# Patient Record
Sex: Female | Born: 1990 | Race: White | Hispanic: No | Marital: Married | Smoking: Never smoker
Health system: Southern US, Community
[De-identification: ages and names within clinical notes are randomized; demographics above are authoritative.]

---
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
## 2017-12-26 ENCOUNTER — Encounter: Payer: Self-pay | Admitting: Emergency Medicine

## 2017-12-26 ENCOUNTER — Emergency Department
Admission: EM | Admit: 2017-12-26 | Discharge: 2017-12-26 | Disposition: A | Discharge status: Home / Self Care | Payer: BC Managed Care – PPO | Source: Home / Self Care | Attending: Family Medicine | Admitting: Family Medicine

## 2017-12-26 DIAGNOSIS — R6889 Other general symptoms and signs: Secondary | ICD-10-CM | POA: Diagnosis not present

## 2017-12-26 MED ORDER — BENZONATATE 100 MG PO CAPS: 100.0000 mg | ORAL_CAPSULE | Freq: Three times a day (TID) | ORAL | 0 refills | Status: DC

## 2017-12-26 MED ORDER — OSELTAMIVIR PHOSPHATE 75 MG PO CAPS: 75.0000 mg | ORAL_CAPSULE | Freq: Two times a day (BID) | ORAL | 0 refills | Status: DC

## 2017-12-26 NOTE — Discharge Instructions (Signed)
°  You may take 500mg acetaminophen every 4-6 hours or in combination with ibuprofen 400-600mg every 6-8 hours as needed for pain, inflammation, and fever. ° °Be sure to drink at least eight 8oz glasses of water to stay well hydrated and get at least 8 hours of sleep at night, preferably more while sick.  ° °Oseltamivir (Tamiflu) may cause stomach upset including nausea, vomiting and diarrhea.  It may also cause dizziness or hallucinations in children.  To help prevent stomach upset, you may take this medication with food.  If you are still having unwanted symptoms, you may stop taking this medication as it is not as important to finish the entire course like antibiotics.  If you have questions/concerns please call our office or follow up with your primary care provider.   ° °

## 2017-12-26 NOTE — ED Triage Notes (Signed)
Pt c/o cough with mucous, sore throat and fever of 101 last night. sxs started yesterday. Denies meds.

## 2017-12-26 NOTE — ED Provider Notes (Signed)
[facility]    CSN: [id number] Arrival date & time: 12/26/17  0935     History   Chief Complaint Chief Complaint  Patient presents with  . Cough    HPI Rebecca James is a 27 y.o. female.   HPI  Rebecca James is a 27 y.o. female presenting to UC with c/o sudden onset productive cough, sore throat, fever Tmax 101*F, and fatigue since yesterday.  She has not taken any OTC medications today.  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  Denies n/v/d.  She did not get the flu vaccine this year but usually does.    History reviewed. No pertinent past medical history.  There are no active problems to display for this patient.   History reviewed. No pertinent surgical history.  OB History    No data available       Home Medications    Prior to Admission medications   Medication Sig Start Date End Date Taking? Authorizing Provider  norethindrone-ethinyl estradiol (JUNEL FE,GILDESS FE,LOESTRIN FE) 1-20 MG-MCG tablet Take 1 tablet by mouth daily.   Yes [provider]  benzonatate (TESSALON) 100 MG capsule Take 1-2 capsules (100-200 mg total) by mouth every 8 (eight) hours. 12/26/17   Lurene ShadowPhelps, Myron Lona O, PA-C  oseltamivir (TAMIFLU) 75 MG capsule Take 1 capsule (75 mg total) by mouth every 12 (twelve) hours. 12/26/17   Lurene ShadowPhelps, Latalia Etzler O, PA-C    Family History History reviewed. No pertinent family history.  Social History Social History   Tobacco Use  . Smoking status: Never Smoker  . Smokeless tobacco: Never Used  Substance Use Topics  . Alcohol use: Yes  . Drug use: Not on file     Allergies   Doxycycline   Review of Systems Review of Systems  Constitutional: Positive for appetite change, chills, fatigue and fever.  HENT: Positive for congestion, rhinorrhea and sore throat. Negative for ear pain, trouble swallowing and voice change.   Respiratory: Positive for cough. Negative for shortness of breath.     Cardiovascular: Negative for chest pain and palpitations.  Gastrointestinal: Negative for abdominal pain, diarrhea, nausea and vomiting.  Musculoskeletal: Positive for arthralgias and myalgias. Negative for back pain.  Skin: Negative for rash.     Physical Exam Triage Vital Signs ED Triage Vitals  Enc Vitals Group     BP 12/26/17 1008 125/75     Pulse Rate 12/26/17 1008 77     Resp --      Temp 12/26/17 1008 98.4 F (36.9 C)     Temp Source 12/26/17 1008 Oral     SpO2 12/26/17 1008 99 %     Weight 12/26/17 1012 154 lb (69.9 kg)     Height --      Head Circumference --      Peak Flow --      Pain Score 12/26/17 1010 0     Pain Loc --      Pain Edu? --      Excl. in GC? --    No data found.  Updated Vital Signs BP 125/75 (BP Location: Right Arm)   Pulse 77   Temp 98.4 F (36.9 C) (Oral)   Wt 154 lb (69.9 kg)   LMP 12/17/2017   SpO2 99%   Visual Acuity Right Eye Distance:   Left Eye Distance:   Bilateral Distance:    Right Eye Near:   Left Eye Near:  Bilateral Near:     Physical Exam  Constitutional: She is oriented to person, place, and time. She appears well-developed and well-nourished. No distress.  HENT:  Head: Normocephalic and atraumatic.  Right Ear: Tympanic membrane normal.  Left Ear: Tympanic membrane normal.  Nose: Mucosal edema present. Right sinus exhibits no maxillary sinus tenderness and no frontal sinus tenderness. Left sinus exhibits no maxillary sinus tenderness and no frontal sinus tenderness.  Mouth/Throat: Uvula is midline, oropharynx is clear and moist and mucous membranes are normal.  Eyes: EOM are normal.  Neck: Normal range of motion. Neck supple.  Cardiovascular: Normal rate and regular rhythm.  Pulmonary/Chest: Effort normal and breath sounds normal. No stridor. No respiratory distress. She has no wheezes. She has no rales.  Musculoskeletal: Normal range of motion.  Lymphadenopathy:    She has no cervical adenopathy.   Neurological: She is alert and oriented to person, place, and time.  Skin: Skin is warm and dry. She is not diaphoretic.  Psychiatric: She has a normal mood and affect. Her behavior is normal.  Nursing note and vitals reviewed.    UC Treatments / Results  Labs (all labs ordered are listed, but only abnormal results are displayed) Labs Reviewed - No data to display  EKG  EKG Interpretation None       Radiology No results found.  Procedures Procedures (including critical care time)  Medications Ordered in UC Medications - No data to display   Initial Impression / Assessment and Plan / UC Course  I have reviewed the triage vital signs and the nursing notes.  Pertinent labs & imaging results that were available during my care of the patient were reviewed by me and considered in my medical decision making (see chart for details).     Hx and exam c/w viral URI, due to active flu season, discussed starting pt on Tamiflu. Pt would like to try Tamiflu Encouraged fluids,rest, acetaminophen,and ibuprofen F/u with PC Pin 1 week if needed.   Final Clinical Impressions(s) / UC Diagnoses   Final diagnoses:  Flu-like symptoms    ED Discharge Orders        Ordered    oseltamivir (TAMIFLU) 75 MG capsule  Every 12 hours     12/26/17 1020    benzonatate (TESSALON) 100 MG capsule  Every 8 hours     12/26/17 1020       Controlled Substance Prescriptions [place] Controlled Substance Registry consulted? Not Applicable   Rolla Plate 12/26/17 1610

## 2018-04-12 ENCOUNTER — Ambulatory Visit: Payer: BC Managed Care – PPO | Admitting: Family Medicine

## 2018-04-12 DIAGNOSIS — M79671 Pain in right foot: Secondary | ICD-10-CM | POA: Diagnosis not present

## 2018-04-12 NOTE — Patient Instructions (Addendum)
Your history, exam, and ultrasound are reassuring. This is consistent with an extensor tendinitis of your ankle/foot. Ice the area 15 minutes at a time 3-4 times a day. Ibuprofen  three times a day with food OR aleve 2 tabs twice a day with food for pain and inflammation. Wear the boot to rest this when up and walking around for the next 2 weeks. Consider physical therapy, nitro patches. Follow up with me in 2 weeks for reevaluation.

## 2018-04-16 ENCOUNTER — Encounter: Payer: Self-pay | Admitting: Family Medicine

## 2018-04-16 DIAGNOSIS — M79671 Pain in right foot: Secondary | ICD-10-CM | POA: Insufficient documentation

## 2018-04-16 NOTE — Assessment & Plan Note (Signed)
low mileage runner, history exam and ultrasound consistent with an extensor tendinitis of her foot/ankle. Icing, ibuprofen or aleve.  Cam walker when up and walking.  F/u in 2 weeks for reevaluation.

## 2018-04-16 NOTE — Progress Notes (Signed)
PCP: Salem Caster, MD  Subjective:   HPI: Patient is a 27 y.o. female here for right foot pain.  Patient denies known injury or trauma. She reports having dorsal right foot pain for about 2 weeks. She was training for a 10 miler about 1 month ago and did well. She runs about 2-3 times a week about 10 miles total per week. No prior history of stress fracture. Pain bothers her with stepping and pointing her right foot. Has tried resting, icing, aleve. She took a week off from runinng as well. 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 No skin changes, numbness.  History reviewed. No pertinent past medical history.  Current Outpatient Medications on File Prior to Visit  Medication Sig Dispense Refill  . NIKKI 3-0.02 MG tablet Take 1 tablet by mouth daily.  3   No current facility-administered medications on file prior to visit.     History reviewed. No pertinent surgical history.  Allergies  Allergen Reactions  . Doxycycline     Social History   Socioeconomic History  . Marital status: Married    Spouse name: Not on file  . Number of children: Not on file  . Years of education: Not on file  . Highest education level: Not on file  Occupational History  . Not on file  Social Needs  . Financial resource strain: Not on file  . Food insecurity:    Worry: Not on file    Inability: Not on file  . Transportation needs:    Medical: Not on file    Non-medical: Not on file  Tobacco Use  . Smoking status: Never Smoker  . Smokeless tobacco: Never Used  Substance and Sexual Activity  . Alcohol use: Yes  . Drug use: Not on file  . Sexual activity: Not on file  Lifestyle  . Physical activity:    Days per week: Not on file    Minutes per session: Not on file  . Stress: Not on file  Relationships  . Social connections:    Talks on phone: Not on file    Gets together: Not on file    Attends religious service: Not on file    Active member of club or  organization: Not on file    Attends meetings of clubs or organizations: Not on file    Relationship status: Not on file  . Intimate partner violence:    Fear of current or ex partner: Not on file    Emotionally abused: Not on file    Physically abused: Not on file    Forced sexual activity: Not on file  Other Topics Concern  . Not on file  Social History Narrative  . Not on file    History reviewed. No pertinent family history.  BP 128/74   Ht  (1.753 m)   Wt 150 lb (68 kg)   BMI 22.15 kg/m   Review of Systems: See HPI above.     Objective:  Physical Exam:  Gen: NAD, comfortable in exam room  Right foot/ankle: No gross deformity, swelling, ecchymoses FROM with 5/5 strength but mild pain on dorsiflexion and plantarflexion within dorsal proximal foot. TTP over extensor digitorum.  No focal bony tenderness. Negative ant drawer and talar tilt.   Negative syndesmotic compression. Negative metatarsal squeeze. Thompsons test negative. Completed hop test with only mild pain. NV intact distally.  Left foot/ankle: No deformity. FROM with 5/5 strength. No tenderness to palpation. NVI distally.  MSK u/s right foot:  No evidence cortical irregularity, edema overlying cortex of metatarsals, talus, other tarsal bones.  No tenosynovitis.  Assessment & Plan:  1. Right foot pain - low mileage runner, history exam and ultrasound consistent with an extensor tendinitis of her foot/ankle. Icing, ibuprofen or aleve.  Cam walker when up and walking.  F/u in 2 weeks for reevaluation.

## 2018-04-25 ENCOUNTER — Ambulatory Visit: Payer: BC Managed Care – PPO | Admitting: Family Medicine

## 2018-04-25 DIAGNOSIS — M79671 Pain in right foot: Secondary | ICD-10-CM

## 2018-04-25 NOTE — Patient Instructions (Signed)
You have an extensor tendinitis of your ankle/foot. Ice the area 15 minutes at a time 3-4 times a day as needed. Ibuprofen 600mg  three times a day with food OR aleve 2 tabs twice a day with food for pain and inflammation as needed. Wear the boot for one more week. After this you can start ZOX:WRUEjog:walk program every other day. This means 1:1 for 10 minutes, next time 2:1 for 15 minutes, 3:1 for 20 minutes, etc. every other day. Ok for squats, lunges, leg press starting in a week also. Follow up with me in 4 weeks or as needed.

## 2018-04-26 ENCOUNTER — Encounter: Payer: Self-pay | Admitting: Family Medicine

## 2018-04-26 NOTE — Assessment & Plan Note (Signed)
2/2 extensor tendinitis of ankle/foot.  Icing, ibuprofen or aleve if needed.  Cam walker for 1 more week then reviewed return to running program.  F/u in 4 weeks or as needed if she's doing well.

## 2018-04-26 NOTE — Progress Notes (Signed)
PCP: Salem Casterhompson, Christopher A, MD  Subjective:   HPI: Patient is a 27 y.o. female here for right foot pain.  5/22: Patient denies known injury or trauma. She reports having dorsal right foot pain for about 2 weeks. She was training for a 10 miler about 1 month ago and did well. She runs about 2-3 times a week about 10 miles total per week. No prior history of stress fracture. Pain bothers her with stepping and pointing her right foot. Has tried resting, icing, aleve. She took a week off from runinng as well. 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 No skin changes, numbness.  6/4: Patient reports she is doing well. Some soreness to 2-3/10 in the morning when wakes up. Can feel if she's on her feet all the time. Pain currently 0/10. Doing well with cam walker. No skin changes.  History reviewed. No pertinent past medical history.  Current Outpatient Medications on File Prior to Visit  Medication Sig Dispense Refill  . NIKKI 3-0.02 MG tablet Take 1 tablet by mouth daily.  3   No current facility-administered medications on file prior to visit.     History reviewed. No pertinent surgical history.  Allergies  Allergen Reactions  . Doxycycline     Social History   Socioeconomic History  . Marital status: Married    Spouse name: Not on file  . Number of children: Not on file  . Years of education: Not on file  . Highest education level: Not on file  Occupational History  . Not on file  Social Needs  . Financial resource strain: Not on file  . Food insecurity:    Worry: Not on file    Inability: Not on file  . Transportation needs:    Medical: Not on file    Non-medical: Not on file  Tobacco Use  . Smoking status: Never Smoker  . Smokeless tobacco: Never Used  Substance and Sexual Activity  . Alcohol use: Yes  . Drug use: Not on file  . Sexual activity: Not on file  Lifestyle  . Physical activity:    Days per week: Not on file    Minutes per session:  Not on file  . Stress: Not on file  Relationships  . Social connections:    Talks on phone: Not on file    Gets together: Not on file    Attends religious service: Not on file    Active member of club or organization: Not on file    Attends meetings of clubs or organizations: Not on file    Relationship status: Not on file  . Intimate partner violence:    Fear of current or ex partner: Not on file    Emotionally abused: Not on file    Physically abused: Not on file    Forced sexual activity: Not on file  Other Topics Concern  . Not on file  Social History Narrative  . Not on file    History reviewed. No pertinent family history.  BP 127/83   Pulse 67   Ht 5\' 9"  (1.753 m)   Wt 150 lb (68 kg)   BMI 22.15 kg/m   Review of Systems: See HPI above.     Objective:  Physical Exam:  Gen: NAD, comfortable in exam room  Right foot/ankle: No gross deformity, swelling, ecchymoses FROM with 5/5 strength without pain now. No TTP Negative ant drawer and talar tilt.   Negative syndesmotic compression. Thompsons test  negative. NV intact distally.   Assessment & Plan:  1. Right foot pain - 2/2 extensor tendinitis of ankle/foot.  Icing, ibuprofen or aleve if needed.  Cam walker for 1 more week then reviewed return to running program.  F/u in 4 weeks or as needed if she's doing well.

## 2018-08-21 ENCOUNTER — Encounter: Payer: Self-pay | Admitting: Emergency Medicine

## 2018-08-21 ENCOUNTER — Emergency Department
Admission: EM | Admit: 2018-08-21 | Discharge: 2018-08-21 | Disposition: A | Discharge status: Home / Self Care | Payer: BC Managed Care – PPO | Source: Home / Self Care | Attending: Family Medicine | Admitting: Family Medicine

## 2018-08-21 DIAGNOSIS — J020 Streptococcal pharyngitis: Secondary | ICD-10-CM | POA: Diagnosis not present

## 2018-08-21 LAB — POCT RAPID STREP A (OFFICE): Rapid Strep A Screen: POSITIVE — AB

## 2018-08-21 MED ORDER — AMOXICILLIN 500 MG PO CAPS: 500.0000 mg | ORAL_CAPSULE | Freq: Two times a day (BID) | ORAL | 0 refills | Status: AC

## 2018-08-21 NOTE — Discharge Instructions (Signed)
°  Please take antibiotics as prescribed and be sure to complete entire course even if you start to feel better to ensure infection does not come back. ° °Please follow up with family medicine in 1 week if not improving.  °

## 2018-08-21 NOTE — ED Provider Notes (Signed)
[facility]    CSN: [id number] Arrival date & time: 08/21/18  0856     History   Chief Complaint Chief Complaint  Patient presents with  . Sore Throat    HPI Rebecca James is a 28 y.o. female.   HPI Rebecca James is a 27 y.o. female presenting to UC with c/o 4 days of sore throat, mild intermittent headache. Throat pain is mild in severity but more persistent than prior sore throat. 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 Denies cough, congestion. She has been alternating Tylenol and Motrin. Denies fever, chills, n/v/d.   History reviewed. No pertinent past medical history.  Patient Active Problem List   Diagnosis Date Noted  . Right foot pain 04/16/2018    History reviewed. No pertinent surgical history.  OB History   None      Home Medications    Prior to Admission medications   Medication Sig Start Date End Date Taking? Authorizing Provider  amoxicillin (AMOXIL) 500 MG capsule Take 1 capsule (500 mg total) by mouth 2 (two) times daily for 10 days. 08/21/18 08/31/18  Lurene Shadow, PA-C  NIKKI 3-0.02 MG tablet Take 1 tablet by mouth daily. 03/02/18   [provider]    Family History History reviewed. No pertinent family history.  Social History Social History   Tobacco Use  . Smoking status: Never Smoker  . Smokeless tobacco: Never Used  Substance Use Topics  . Alcohol use: Yes  . Drug use: Not on file     Allergies   Doxycycline   Review of Systems Review of Systems  Constitutional: Negative for chills and fever.  HENT: Positive for sore throat. Negative for congestion, ear pain, trouble swallowing and voice change.   Respiratory: Negative for cough and shortness of breath.   Cardiovascular: Negative for chest pain and palpitations.  Gastrointestinal: Negative for abdominal pain, diarrhea, nausea and vomiting.  Musculoskeletal: Negative for  arthralgias, back pain and myalgias.  Skin: Negative for rash.  Neurological: Positive for headaches. Negative for dizziness and light-headedness.     Physical Exam Triage Vital Signs ED Triage Vitals  Enc Vitals Group     BP 08/21/18 0937 120/76     Pulse Rate 08/21/18 0937 76     Resp --      Temp 08/21/18 0937 98.4 F (36.9 C)     Temp Source 08/21/18 0937 Oral     SpO2 08/21/18 0937 100 %     Weight 08/21/18 0938 156 lb (70.8 kg)     Height --      Head Circumference --      Peak Flow --      Pain Score 08/21/18 0938 7     Pain Loc --      Pain Edu? --      Excl. in GC? --    No data found.  Updated Vital Signs BP 120/76 (BP Location: Right Arm)   Pulse 76   Temp 98.4 F (36.9 C) (Oral)   Wt 156 lb (70.8 kg)   LMP 07/23/2018 (Approximate)   SpO2 100%   BMI 23.04 kg/m   Visual Acuity Right Eye Distance:   Left Eye Distance:   Bilateral Distance:    Right Eye Near:   Left Eye Near:    Bilateral Near:     Physical Exam  Constitutional: She is oriented to person, place, and time. She appears  well-developed and well-nourished.  Non-toxic appearance. She does not appear ill. No distress.  HENT:  Head: Normocephalic and atraumatic.  Right Ear: Tympanic membrane normal.  Left Ear: Tympanic membrane normal.  Nose: Nose normal. Right sinus exhibits no maxillary sinus tenderness and no frontal sinus tenderness. Left sinus exhibits no maxillary sinus tenderness and no frontal sinus tenderness.  Mouth/Throat: Uvula is midline and mucous membranes are normal. Posterior oropharyngeal erythema present. No oropharyngeal exudate, posterior oropharyngeal edema or tonsillar abscesses.  Eyes: EOM are normal.  Neck: Normal range of motion. Neck supple. No thyromegaly present.  Cardiovascular: Normal rate and regular rhythm.  Pulmonary/Chest: Effort normal and breath sounds normal. No stridor. No respiratory distress. She has no wheezes. She has no rhonchi.  Musculoskeletal:  Normal range of motion.  Lymphadenopathy:    She has cervical adenopathy.  Neurological: She is alert and oriented to person, place, and time.  Skin: Skin is warm and dry.  Psychiatric: She has a normal mood and affect. Her behavior is normal.  Nursing note and vitals reviewed.    UC Treatments / Results  Labs (all labs ordered are listed, but only abnormal results are displayed) Labs Reviewed  POCT RAPID STREP A (OFFICE) - Abnormal; Notable for the following components:      Result Value   Rapid Strep A Screen Positive (*)    All other components within normal limits    EKG None  Radiology No results found.  Procedures Procedures (including critical care time)  Medications Ordered in UC Medications - No data to display  Initial Impression / Assessment and Plan / UC Course  I have reviewed the triage vital signs and the nursing notes.  Pertinent labs & imaging results that were available during my care of the patient were reviewed by me and considered in my medical decision making (see chart for details).     Rapid strep: POSITIVE No tonsillar abscess. Home care instructions provided.  Final Clinical Impressions(s) / UC Diagnoses   Final diagnoses:  Strep pharyngitis     Discharge Instructions      Please take antibiotics as prescribed and be sure to complete entire course even if you start to feel better to ensure infection does not come back.  Please follow up with family medicine in 1 week if not improving.     ED Prescriptions    Medication Sig Dispense Auth. Provider   amoxicillin (AMOXIL) 500 MG capsule Take 1 capsule (500 mg total) by mouth 2 (two) times daily for 10 days. 20 capsule Lurene Shadow, PA-C     Controlled Substance Prescriptions Power Controlled Substance Registry consulted? Not Applicable   Rolla Plate 08/21/18 1724

## 2018-08-21 NOTE — ED Triage Notes (Signed)
Pt c/o sore throat x4 days. Denies fever. Taking tylenol

## 2019-05-16 ENCOUNTER — Ambulatory Visit: Payer: BC Managed Care – PPO | Admitting: Family Medicine

## 2019-05-16 ENCOUNTER — Other Ambulatory Visit: Payer: Self-pay

## 2019-05-16 ENCOUNTER — Ambulatory Visit: Payer: Self-pay

## 2019-05-16 VITALS — BP 112/72 | Ht 69.0 in | Wt 155.0 lb

## 2019-05-16 DIAGNOSIS — M25551 Pain in right hip: Secondary | ICD-10-CM

## 2019-05-16 NOTE — Patient Instructions (Signed)
You have strained the proximal aspect of your rectus femoris, to a lesser extent your sartorius. Start physical therapy and do home exercises on days you don't go to therapy. Icing 15 minutes at a time 3-4 times a day. Avoid painful activities when possible and slowly advance these as tolerated under the direction of therapy. Tylenol, ibuprofen as needed. Follow up with me in 5-6 weeks for reevaluation.

## 2019-05-16 NOTE — Progress Notes (Signed)
   HPI  CC: Right hip pain  Rebecca James is a 28 year old female who presents for right hip pain.  She states the pain started around 1 month ago.  She states she was crosstraining pretty substantially prior to COVID-19.  She states that once COVID started, she started exclusively running.  She was running 4-5 times a week, 3 to 7 miles per session.  She states she started noticing some hip pain around that time.  She states the pain is worse when she flexes her knee, was sharp.  She states that she noticed it when she walks as well.  She has not walked around the last 2 weeks.  She states she has improvement during this time.  She denies any numbness tingling in her leg.  She denies any weakness.  She denies any fevers or chills.  She denies any dizziness.  He has no history of trauma to the area.  See HPI and/or previous note for associated ROS.  All past medical history, medications, and allergies reviewed myself at today's visit.  Objective: BP 112/72   Ht 5\' 9"  (1.753 m)   Wt 155 lb (70.3 kg)   BMI 22.89 kg/m  Gen: Right-Hand Dominant. NAD, well groomed, a/o x3, normal affect.  CV: Well-perfused. Warm.  Resp: Non-labored.  Neuro: Sensation intact throughout. No gross coordination deficits.  Gait: Nonpathologic posture, unremarkable stride without signs of limp or balance issues.  Right hip exam: No erythema, warmth and swelling noted.  Tenderness palpation over the AIIS.  Full range of motion hip flexion, extension.  Pain with hip flexion.  Strength 5 out of 5 throughout test.  Negative logroll test.  Negative FABER and FADIR testing.  Negative hop test.    Left hip exam: No erythema, warmth and swelling noted.  No tenderness palpation.  Full range of motion hip flexion, extension. Strength 5 out of 5 throughout test.  Negative logroll test.  Negative FABER and FADIR testing.  Negative hop test.    Limited ultrasound: Ultrasound of the right hip.  Hip joint visualized without signs of any  fluid or degenerative change.  Rectus femoris visualized as attachment the AIIS.  Hypoechoic change noted near the attachment site, With associated fluid.  No frank tear is noted.  Assessment and plan: Right-sided rectus Femoris strain  We discussed treatment options with the patient at today's visit.  At this time we will start her in formal physical therapy for the rectus femoris strain.  She can take anti-inflammatories as needed.  Ice the area as tolerated.  She should hold off any extensive running until she has recovered.  We will see her back in follow-up in 6 weeks.   Lewanda Rife, MD [facility] 05/16/2019 3:43 PM

## 2019-05-17 ENCOUNTER — Encounter: Payer: Self-pay | Admitting: Family Medicine

## 2019-06-20 ENCOUNTER — Encounter: Payer: Self-pay | Admitting: Family Medicine

## 2019-06-20 ENCOUNTER — Ambulatory Visit
Admission: RE | Admit: 2019-06-20 | Discharge: 2019-06-20 | Disposition: A | Discharge status: Home / Self Care | Payer: BC Managed Care – PPO | Source: Ambulatory Visit | Attending: Family Medicine | Admitting: Family Medicine

## 2019-06-20 ENCOUNTER — Other Ambulatory Visit: Payer: Self-pay

## 2019-06-20 ENCOUNTER — Ambulatory Visit: Payer: BC Managed Care – PPO | Admitting: Family Medicine

## 2019-06-20 VITALS — BP 130/78 | Ht 69.0 in | Wt 155.0 lb

## 2019-06-20 DIAGNOSIS — M25551 Pain in right hip: Secondary | ICD-10-CM | POA: Diagnosis not present

## 2019-06-20 NOTE — Progress Notes (Signed)
HPI: Pt is here for follow-up of right lateral hip pain.  Last visit she was diagnosed with a rectus femoris strain, and has been going to physical therapy twice a week since that time.  Physical therapy noted she has a tight TFL and a weak glute med, she tried running 1 minute on 2 minutes off for 10 minutes, but experienced pain after or during each run.  Aggravating factors also include laying on her right side.  Pain is alleviated with rest, ice, and massaging her lateral hip area.  She is able to walk pain-free.  No bruising, swelling.  History reviewed. No pertinent past medical history.  Current Outpatient Medications on File Prior to Visit  Medication Sig Dispense Refill  . NIKKI 3-0.02 MG tablet Take 1 tablet by mouth daily.  3   No current facility-administered medications on file prior to visit.     History reviewed. No pertinent surgical history.  Allergies  Allergen Reactions  . Doxycycline     Social History   Socioeconomic History  . Marital status: Married    Spouse name: Not on file  . Number of children: Not on file  . Years of education: Not on file  . Highest education level: Not on file  Occupational History  . Not on file  Social Needs  . Financial resource strain: Not on file  . Food insecurity    Worry: Not on file    Inability: Not on file  . Transportation needs    Medical: Not on file    Non-medical: Not on file  Tobacco Use  . Smoking status: Never Smoker  . Smokeless tobacco: Never Used  Substance and Sexual Activity  . Alcohol use: Yes  . Drug use: Not on file  . Sexual activity: Not on file  Lifestyle  . Physical activity    Days per week: Not on file    Minutes per session: Not on file  . Stress: Not on file  Relationships  . Social Musicianconnections    Talks on phone: Not on file    Gets together: Not on file    Attends religious service: Not on file    Active member of club or organization: Not on file    Attends meetings of clubs or  organizations: Not on file    Relationship status: Not on file  . Intimate partner violence    Fear of current or ex partner: Not on file    Emotionally abused: Not on file    Physically abused: Not on file    Forced sexual activity: Not on file  Other Topics Concern  . Not on file  Social History Narrative  . Not on file    History reviewed. No pertinent family history.  BP 130/78   Ht 5\' 9"  (1.753 m)   Wt 155 lb (70.3 kg)   BMI 22.89 kg/m   ROS:  All others negative or as described in the HPI CONST: no F/C, no malaise, no fatigue MSK: See above NEURO: no numbness/tingling, no weakness SKIN: no rash, no lesions HEME: no bleeding, no bruising, no erythema  Objective: Right hip: Sensation intact bilaterally Tenderness to palpation the posterior greater trochanter, glute medius, glute minimus Glue medius strength is 4 out of 5 compared to right leg.  Rectus and quad strength is 5 out of 5 Negative FADIR, negative straight leg raise Pain at lateral hip with FABER FROM. Able to complete hop test with pain superolateral hip and in IT  band area.  Assessment and Plan:  1.  Right hip pain Given that patient has had minimal improvement with physical therapy and over-the-counter treatment, we will order an x-ray and MRI to evaluate for possible stress fracture.  We have advised to continue physical therapy at this time.  Patient will follow-up after her MRI to discuss results.  Lanier Clam, DO, [facility] Sports Medicine Fellow

## 2019-06-20 NOTE — Patient Instructions (Signed)
We will go ahead with an MRI to assess for a stress fracture. Continue with physical therapy, home exercises in the meantime. Tylenol if needed for pain, soreness.

## 2019-06-22 ENCOUNTER — Ambulatory Visit
Admission: RE | Admit: 2019-06-22 | Discharge: 2019-06-22 | Disposition: A | Discharge status: Home / Self Care | Payer: BC Managed Care – PPO | Source: Ambulatory Visit | Attending: Family Medicine | Admitting: Family Medicine

## 2019-06-22 ENCOUNTER — Other Ambulatory Visit: Payer: Self-pay

## 2019-06-22 DIAGNOSIS — M25551 Pain in right hip: Secondary | ICD-10-CM

## 2019-06-27 ENCOUNTER — Other Ambulatory Visit: Payer: Self-pay

## 2019-06-27 ENCOUNTER — Ambulatory Visit (INDEPENDENT_AMBULATORY_CARE_PROVIDER_SITE_OTHER): Payer: BC Managed Care – PPO | Admitting: Family Medicine

## 2019-06-27 VITALS — BP 100/70 | Wt 155.0 lb

## 2019-06-27 DIAGNOSIS — M25551 Pain in right hip: Secondary | ICD-10-CM

## 2019-06-27 NOTE — Progress Notes (Signed)
Patient came in today to review her MRI of her hip.  No evidence stress fracture.  Has a small synovial outpouching that is not clinically relevant to her pain.  Encouraged to continue with physical therapy, home exercises.  She's slowly increasing her walk:jog program with pain as her guide.  Follow up in 6 weeks.

## 2019-08-08 ENCOUNTER — Ambulatory Visit (INDEPENDENT_AMBULATORY_CARE_PROVIDER_SITE_OTHER): Payer: BC Managed Care – PPO | Admitting: Family Medicine

## 2019-08-08 ENCOUNTER — Other Ambulatory Visit: Payer: Self-pay

## 2019-08-08 VITALS — BP 122/78 | Ht 70.0 in | Wt 155.0 lb

## 2019-08-08 DIAGNOSIS — M25551 Pain in right hip: Secondary | ICD-10-CM

## 2019-08-08 NOTE — Patient Instructions (Signed)
Continue with your home exercises - make sure to add IT band stretches if you're not already doing these as your current pain is more consistent with this being an issue. If things get worse let me know otherwise continue with your activities, running as tolerated and follow up with me as needed.

## 2019-08-09 ENCOUNTER — Encounter: Payer: Self-pay | Admitting: Family Medicine

## 2019-08-09 NOTE — Progress Notes (Signed)
PCP: Salem Casterhompson, Christopher A, MD  Subjective:   HPI: Patient is a 28 y.o. female here for right hip pain.  7/29: Pt is here for follow-up of right lateral hip pain.  Last visit she was diagnosed with a rectus femoris strain, and has been going to physical therapy twice a week since that time.  Physical therapy noted she has a tight TFL and a weak glute med, she tried running 1 minute on 2 minutes off for 10 minutes, but experienced pain after or during each run.  Aggravating factors also include laying on her right side.  Pain is alleviated with rest, ice, and massaging her lateral hip area.  She is able to walk pain-free.  No bruising, swelling.  9/16: Patient reports she's improved since last visit. Still with pain especially when lying on right side and when changing positions (sitting to standing especially). Eases up when she runs and is able to do this now. No new injuries. Doing home exercises. No swelling.  History reviewed. No pertinent past medical history.  Current Outpatient Medications on File Prior to Visit  Medication Sig Dispense Refill  . NIKKI 3-0.02 MG tablet Take 1 tablet by mouth daily.  3   No current facility-administered medications on file prior to visit.     History reviewed. No pertinent surgical history.  Allergies  Allergen Reactions  . Doxycycline     Social History   Socioeconomic History  . Marital status: Married    Spouse name: Not on file  . Number of children: Not on file  . Years of education: Not on file  . Highest education level: Not on file  Occupational History  . Not on file  Social Needs  . Financial resource strain: Not on file  . Food insecurity    Worry: Not on file    Inability: Not on file  . Transportation needs    Medical: Not on file    Non-medical: Not on file  Tobacco Use  . Smoking status: Never Smoker  . Smokeless tobacco: Never Used  Substance and Sexual Activity  . Alcohol use: Yes  . Drug use: Not on  file  . Sexual activity: Not on file  Lifestyle  . Physical activity    Days per week: Not on file    Minutes per session: Not on file  . Stress: Not on file  Relationships  . Social Musicianconnections    Talks on phone: Not on file    Gets together: Not on file    Attends religious service: Not on file    Active member of club or organization: Not on file    Attends meetings of clubs or organizations: Not on file    Relationship status: Not on file  . Intimate partner violence    Fear of current or ex partner: Not on file    Emotionally abused: Not on file    Physically abused: Not on file    Forced sexual activity: Not on file  Other Topics Concern  . Not on file  Social History Narrative  . Not on file    History reviewed. No pertinent family history.  BP 122/78   Ht 5\' 10"  (1.778 m)   Wt 155 lb (70.3 kg)   BMI 22.24 kg/m   Review of Systems: See HPI above.     Objective:  Physical Exam:  Gen: NAD, comfortable in exam room  Right hip: No deformity, instability. FROM with 5/5 strength flexion and abduction. Mild  tenderness to palpation over proximal IT band, greater trochanter. NVI distally. Negative logroll, fadir, faber.   Assessment & Plan:  1. Right hip pain - MRI was reassuring.  Patient improving mildly since last visit.  Pain currently localizes consistent with proximal IT band syndrome - reviewed stretches for this.  Continue with other home exercises.  Tylenol only if needed.  F/u prn but call if not improving as expected.

## 2020-07-19 ENCOUNTER — Emergency Department
Admission: RE | Admit: 2020-07-19 | Discharge: 2020-07-19 | Disposition: A | Discharge status: Home / Self Care | Payer: BC Managed Care – PPO | Source: Ambulatory Visit | Attending: Emergency Medicine | Admitting: Emergency Medicine

## 2020-07-19 ENCOUNTER — Other Ambulatory Visit: Payer: Self-pay

## 2020-07-19 VITALS — BP 119/75 | HR 108 | Temp 98.0°F | Ht 70.0 in | Wt 155.0 lb

## 2020-07-19 DIAGNOSIS — J069 Acute upper respiratory infection, unspecified: Secondary | ICD-10-CM | POA: Diagnosis not present

## 2020-07-19 LAB — POC SARS CORONAVIRUS 2 AG -  ED: SARS Coronavirus 2 Ag: NEGATIVE

## 2020-07-19 NOTE — Discharge Instructions (Addendum)
Today, your rapid Covid test was negative. On physical exam, throat looks within normal limits.  No sign of strep. You likely have a mild viral upper respiratory tract infection.. Only treatment is rest and plenty of fluids.  Tylenol or ibuprofen if needed for pain or fever.  Mucinex. Seek medical care if any severe worsening symptoms.

## 2020-07-19 NOTE — ED Triage Notes (Signed)
Pt states that she has a sore throat, congestion, fever (last night 99-100). Pt states that she has been having symptoms x1. Pt states that she has been vaccinated. Pt states that she is a Engineer, site.

## 2020-07-19 NOTE — ED Provider Notes (Signed)
[facility]    CSN: [id number] Arrival date & time: 07/19/20  0856      History   Chief Complaint Chief Complaint  Patient presents with  . Sore Throat    HPI Bayla Trigonis Pagliarulo is a 29 y.o. female.   HPI She teaches third grade.   About 1 to 2 days of sore throat sinus congestion, low-grade fever 99-100 last night.  No fever this morning.  Sore throat resolved.  Has minimal sinus congestion and drainage. Review of systems otherwise negative. History reviewed. No pertinent past medical history.  Patient Active Problem List   Diagnosis Date Noted  . Right foot pain 04/16/2018    History reviewed. No pertinent surgical history.  OB History   No obstetric history on file.      Home Medications    Prior to Admission medications   Medication Sig Start Date End Date Taking? Authorizing Provider  NIKKI 3-0.02 MG tablet Take 1 tablet by mouth daily. 03/02/18   [provider]    Family History Family History  Problem Relation Age of Onset  . Cancer Mother     Social History Social History   Tobacco Use  . Smoking status: Never Smoker  . Smokeless tobacco: Never Used  Vaping Use  . Vaping Use: Never used  Substance Use Topics  . Alcohol use: Not Currently  . Drug use: Never     Allergies   Doxycycline   Review of Systems Review of Systems   Physical Exam Triage Vital Signs ED Triage Vitals  Enc Vitals Group     BP 07/19/20 0913 119/75     Pulse Rate 07/19/20 0913 (!) 108     Resp --      Temp 07/19/20 0913 98 F (36.7 C)     Temp Source 07/19/20 0913 Oral     SpO2 07/19/20 0913 99 %     Weight 07/19/20 0911 155 lb (70.3 kg)     Height 07/19/20 0911 5\' 10"  (1.778 m)     Head Circumference --      Peak Flow --      Pain Score 07/19/20 0911 0     Pain Loc --      Pain Edu? --      Excl. in GC? --    No data found.  Updated Vital Signs BP 119/75 (BP Location: Right Arm)   Pulse (!) 108   Temp 98 F (36.7 C)  (Oral)   Ht 5\' 10"  (1.778 m)   Wt 70.3 kg   LMP 06/28/2020   SpO2 99%   BMI 22.24 kg/m   Visual Acuity Right Eye Distance:   Left Eye Distance:   Bilateral Distance:    Right Eye Near:   Left Eye Near:    Bilateral Near:     Physical Exam Vitals and nursing note reviewed.  Constitutional:      General: She is not in acute distress.    Appearance: She is well-developed. She is not toxic-appearing.  HENT:     Head: Normocephalic and atraumatic.     Right Ear: Tympanic membrane normal.     Left Ear: Tympanic membrane normal.     Nose:     Right Sinus: No maxillary sinus tenderness.     Left Sinus: No maxillary sinus tenderness.     Comments: Mild nasal congestion    Mouth/Throat:     Mouth: Mucous membranes are moist.     Pharynx: Oropharynx is  clear. No oropharyngeal exudate or posterior oropharyngeal erythema.  Eyes:     General: No scleral icterus.       Right eye: No discharge.        Left eye: No discharge.     Conjunctiva/sclera: Conjunctivae normal.  Cardiovascular:     Rate and Rhythm: Normal rate and regular rhythm.     Heart sounds: Normal heart sounds.     Comments: Pulse repeated 92 Pulmonary:     Effort: No respiratory distress.     Breath sounds: Normal breath sounds. No wheezing, rhonchi or rales.  Musculoskeletal:     Cervical back: Neck supple.  Lymphadenopathy:     Cervical: No cervical adenopathy.  Skin:    General: Skin is warm and dry.  Neurological:     Mental Status: She is alert and oriented to person, place, and time.      UC Treatments / Results  Labs (all labs ordered are listed, but only abnormal results are displayed) Labs Reviewed  POC SARS CORONAVIRUS 2 AG -  ED - Normal    EKG   Radiology No results found.  Procedures Procedures (including critical care time)  Medications Ordered in UC Medications - No data to display  Initial Impression / Assessment and Plan / UC Course  I have reviewed the triage vital signs  and the nursing notes.  Pertinent labs & imaging results that were available during my care of the patient were reviewed by me and considered in my medical decision making (see chart for details).      Final Clinical Impressions(s) / UC Diagnoses   Final diagnoses:  Viral URI     Discharge Instructions     Today, your rapid Covid test was negative. On physical exam, throat looks within normal limits.  No sign of strep. You likely have a mild viral upper respiratory tract infection.. Only treatment is rest and plenty of fluids.  Tylenol or ibuprofen if needed for pain or fever.  Mucinex. Seek medical care if any severe worsening symptoms.    ED Prescriptions    None     PDMP not reviewed this encounter.   Lajean Manes, MD 07/19/20 [phone]

## 2020-10-07 IMAGING — MR MRI OF THE RIGHT HIP WITHOUT CONTRAST
6 series · 40 of 40 positions shown · non-contrast
Comparison: Plain films right hip 06/20/2019.

CLINICAL DATA: Right hip pain with running and rising from a seated
position for 3-1/2 months. No known injury.

EXAM:
MR OF THE RIGHT HIP WITHOUT CONTRAST
TECHNIQUE: Multiplanar, multisequence MR imaging was performed. No intravenous
contrast was administered.

[Series 3: T1 · coronal · 4.0mm · 1.19mm/px · 6 of 24 slices shown]
[im 1/24]
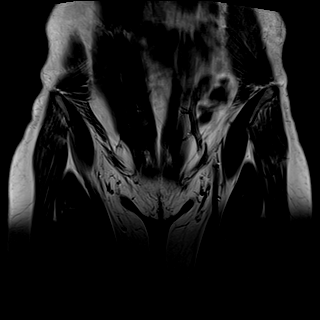
[im 5/24]
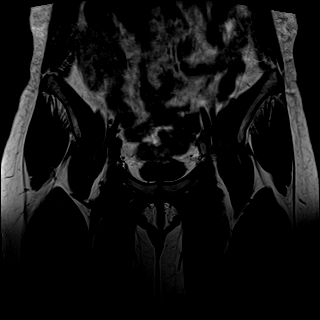
[im 10/24]
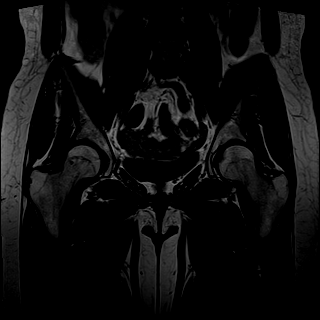
[im 14/24]
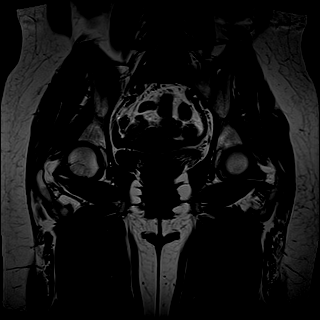
[im 19/24]
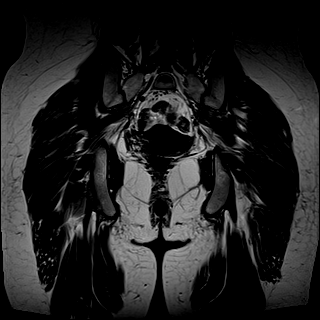
[im 24/24]
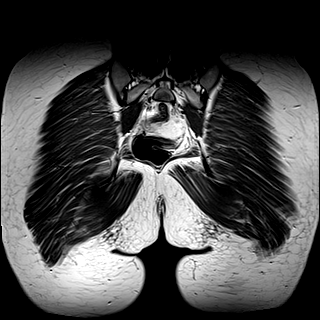

[Series 4: T2 fat-sat · coronal · 4.0mm · 1.19mm/px · 7 of 24 slices shown (1 of 2)]
[im 1/24]
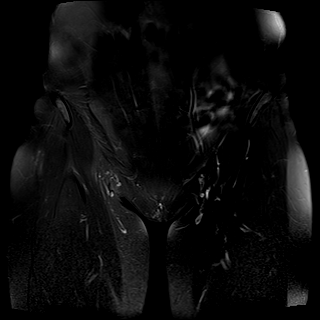
[im 4/24]
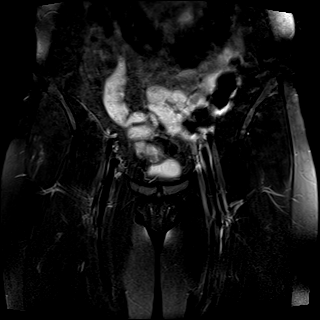
[im 8/24]
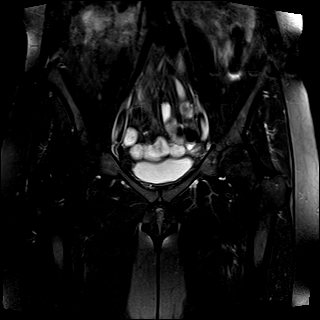
[im 12/24]
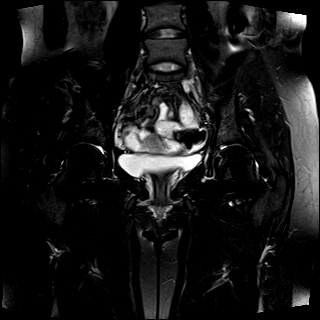
[im 16/24]
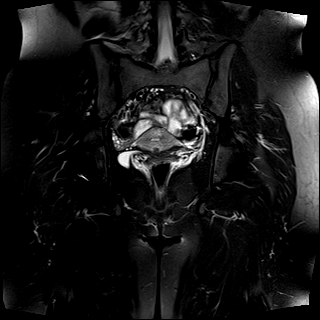
[im 20/24]
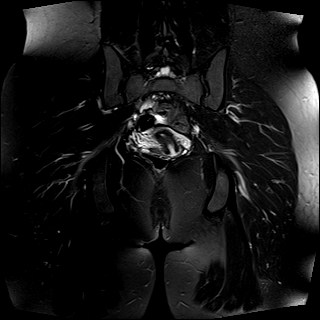
[im 24/24]
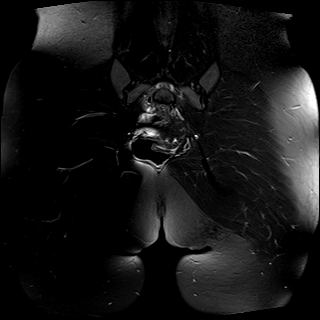

[Series 5: T2 fat-sat · axial · 4.0mm · 0.35mm/px · z∈[-178,-43]mm · 8 of 28 slices shown (2 of 2)]
[im 1/28]
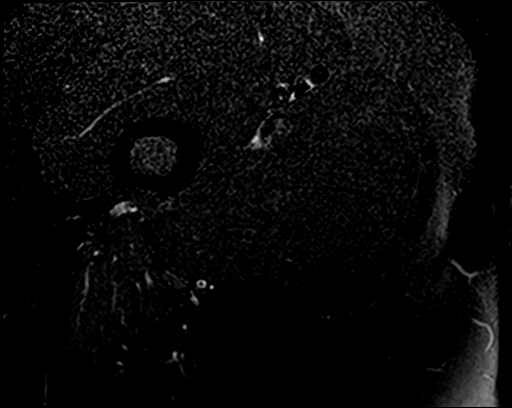
[im 4/28]
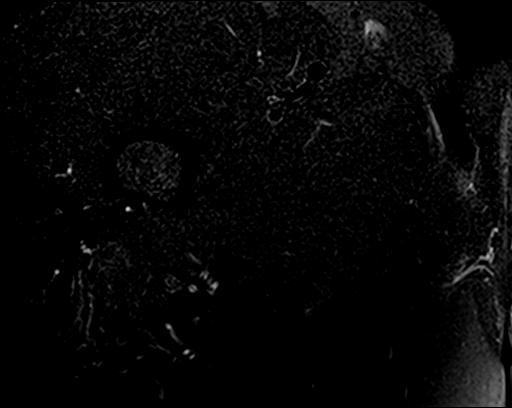
[im 8/28]
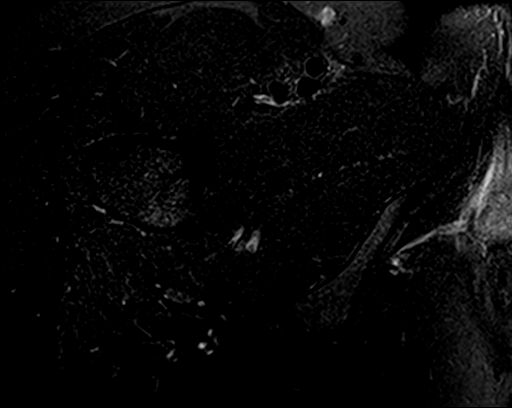
[im 12/28]
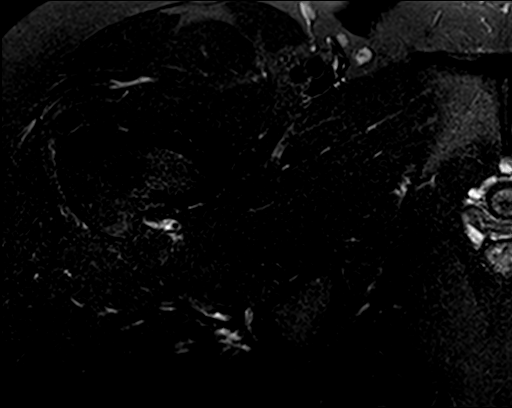
[im 16/28]
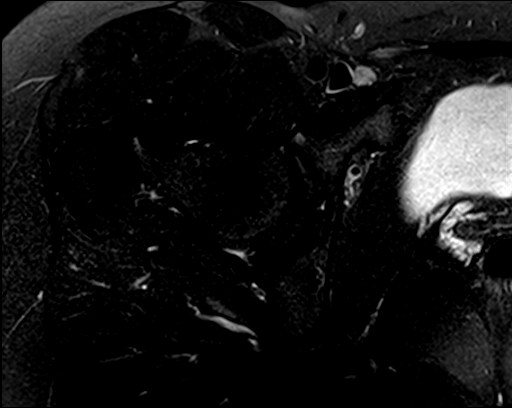
[im 20/28]
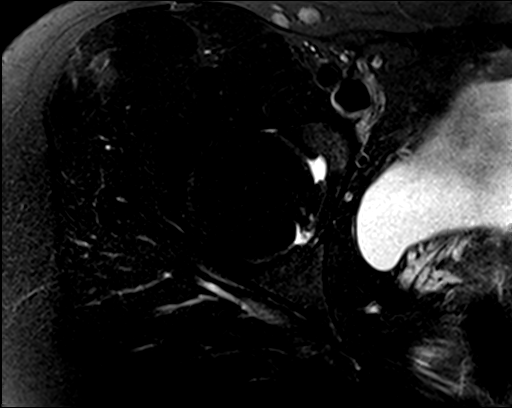
[im 24/28]
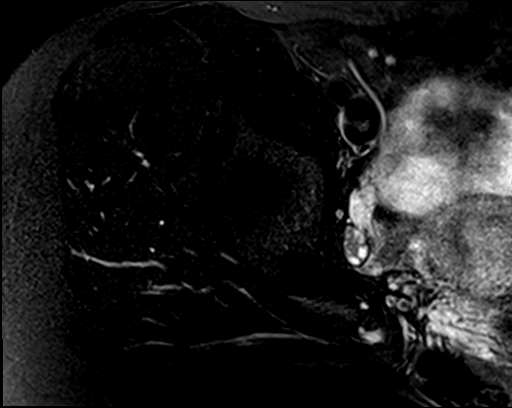
[im 28/28]
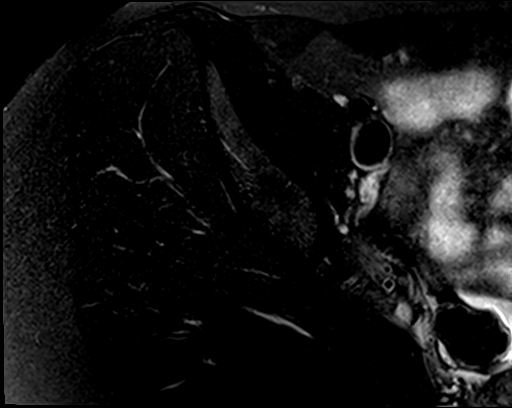

[Series 6: PD fat-sat · sagittal · 4.0mm · 0.74mm/px · 7 of 27 slices shown (1 of 3)]
[im 1/27]
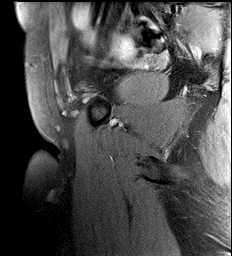
[im 5/27]
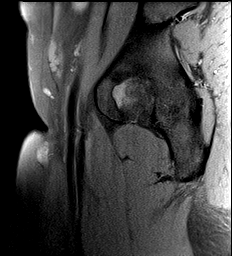
[im 9/27]
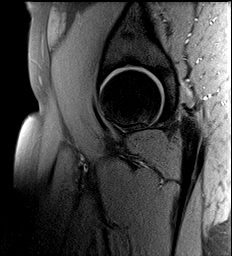
[im 14/27]
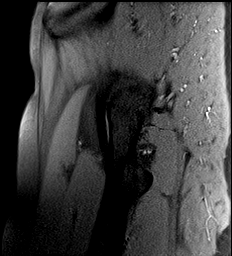
[im 18/27]
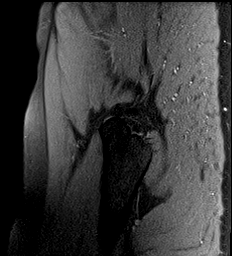
[im 22/27]
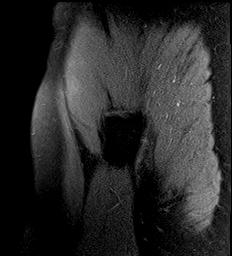
[im 27/27]
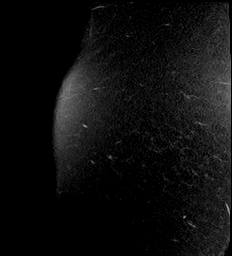

[Series 7: PD fat-sat · coronal · 4.0mm · 0.70mm/px · 5 of 19 slices shown (2 of 3)]
[im 1/19]
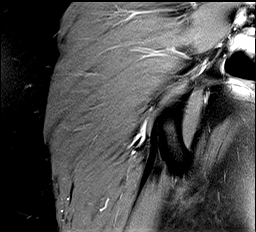
[im 5/19]
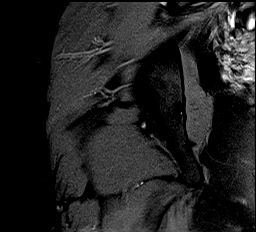
[im 10/19]
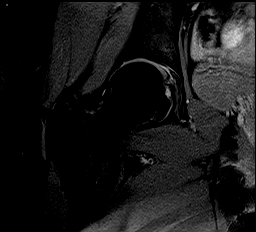
[im 14/19]
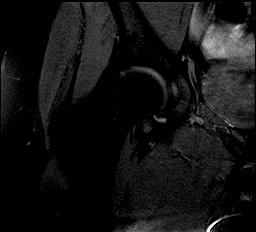
[im 19/19]
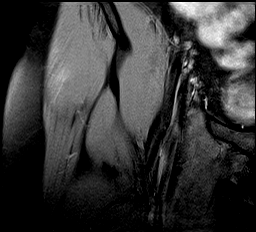

[Series 8: PD fat-sat · oblique · 4.0mm · 0.70mm/px · 7 of 24 slices shown (3 of 3)]
[im 1/24]
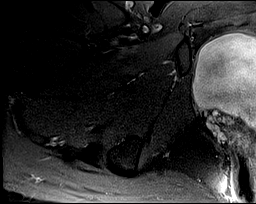
[im 4/24]
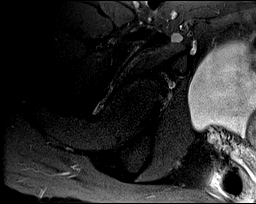
[im 8/24]
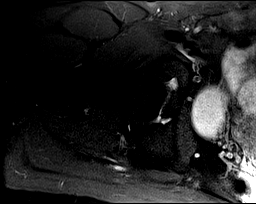
[im 12/24]
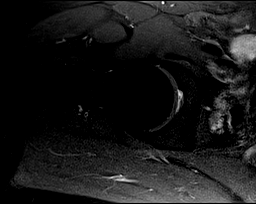
[im 16/24]
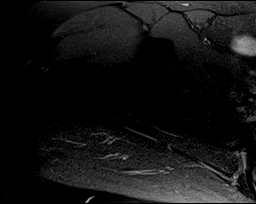
[im 20/24]
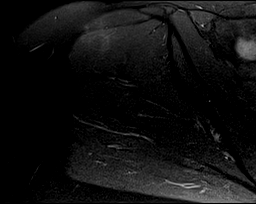
[im 24/24]
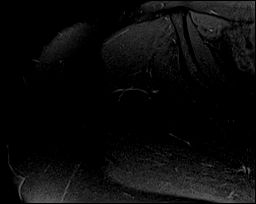

[40 of 40 positions shown; findings below may reference images not displayed]

FINDINGS: Bones: Very small synovial herniation pit is noted at the anterior
head neck junction of the right hip. Marrow signal is otherwise
unremarkable. No fracture, stress change or worrisome lesion. No
avascular necrosis of the femoral heads. No subchondral cyst
formation or edema about the hips. SI joints and symphysis pubis
appear normal.

Articular cartilage and labrum

Articular cartilage:  Normal.

Labrum:  Normal.

Joint or bursal effusion

Joint effusion:  None.

Bursae: Negative.

Muscles and tendons

Muscles and tendons:  Intact and normal in appearance.

Other findings

Miscellaneous:   Imaged intrapelvic contents appear normal.
IMPRESSION: Tiny synovial herniation pit head neck junction anterior right hip.
The study is otherwise unremarkable.
